# Patient Record
Sex: Female | Born: 1935 | Race: Black or African American | Hispanic: No
Health system: Southern US, Community
[De-identification: ages and names within clinical notes are randomized; demographics above are authoritative.]

---

## 1999-08-24 ENCOUNTER — Encounter: Admission: RE | Admit: 1999-08-24 | Discharge: 1999-08-24 | Payer: Self-pay | Admitting: Internal Medicine

## 1999-08-24 ENCOUNTER — Encounter: Payer: Self-pay | Admitting: Internal Medicine

## 2000-08-24 ENCOUNTER — Encounter: Admission: RE | Admit: 2000-08-24 | Discharge: 2000-08-24 | Payer: Self-pay | Admitting: Internal Medicine

## 2000-08-24 ENCOUNTER — Encounter: Payer: Self-pay | Admitting: Internal Medicine

## 2001-02-27 ENCOUNTER — Encounter (INDEPENDENT_AMBULATORY_CARE_PROVIDER_SITE_OTHER): Payer: Self-pay | Admitting: Specialist

## 2001-02-27 ENCOUNTER — Ambulatory Visit (HOSPITAL_BASED_OUTPATIENT_CLINIC_OR_DEPARTMENT_OTHER): Admission: RE | Admit: 2001-02-27 | Discharge: 2001-02-27 | Payer: Self-pay | Admitting: General Surgery

## 2001-06-28 ENCOUNTER — Encounter: Payer: Self-pay | Admitting: Gastroenterology

## 2001-08-25 ENCOUNTER — Encounter: Payer: Self-pay | Admitting: Internal Medicine

## 2001-08-25 ENCOUNTER — Encounter: Admission: RE | Admit: 2001-08-25 | Discharge: 2001-08-25 | Payer: Self-pay | Admitting: Internal Medicine

## 2001-10-24 ENCOUNTER — Encounter: Payer: Self-pay | Admitting: Internal Medicine

## 2001-10-24 ENCOUNTER — Encounter: Admission: RE | Admit: 2001-10-24 | Discharge: 2001-10-24 | Payer: Self-pay | Admitting: Internal Medicine

## 2002-02-12 ENCOUNTER — Ambulatory Visit (HOSPITAL_COMMUNITY): Admission: RE | Admit: 2002-02-12 | Discharge: 2002-02-12 | Payer: Self-pay | Admitting: Infectious Diseases

## 2002-02-12 ENCOUNTER — Encounter: Payer: Self-pay | Admitting: Infectious Diseases

## 2002-09-11 ENCOUNTER — Encounter: Payer: Self-pay | Admitting: Internal Medicine

## 2002-09-11 ENCOUNTER — Encounter: Admission: RE | Admit: 2002-09-11 | Discharge: 2002-09-11 | Payer: Self-pay | Admitting: Internal Medicine

## 2003-05-08 ENCOUNTER — Encounter: Admission: RE | Admit: 2003-05-08 | Discharge: 2003-05-08 | Payer: Self-pay | Admitting: Internal Medicine

## 2004-07-13 ENCOUNTER — Encounter: Admission: RE | Admit: 2004-07-13 | Discharge: 2004-07-13 | Payer: Self-pay | Admitting: Internal Medicine

## 2005-08-04 ENCOUNTER — Encounter: Admission: RE | Admit: 2005-08-04 | Discharge: 2005-08-04 | Payer: Self-pay | Admitting: Neurology

## 2006-08-16 ENCOUNTER — Encounter: Admission: RE | Admit: 2006-08-16 | Discharge: 2006-08-16 | Payer: Self-pay | Admitting: Internal Medicine

## 2006-11-28 ENCOUNTER — Ambulatory Visit: Payer: Self-pay | Admitting: Gastroenterology

## 2007-01-10 ENCOUNTER — Encounter: Payer: Self-pay | Admitting: Gastroenterology

## 2007-01-10 ENCOUNTER — Ambulatory Visit: Payer: Self-pay | Admitting: Gastroenterology

## 2007-01-10 DIAGNOSIS — K210 Gastro-esophageal reflux disease with esophagitis: Secondary | ICD-10-CM

## 2007-01-10 DIAGNOSIS — K449 Diaphragmatic hernia without obstruction or gangrene: Secondary | ICD-10-CM | POA: Insufficient documentation

## 2007-07-17 ENCOUNTER — Ambulatory Visit: Payer: Self-pay | Admitting: Surgery

## 2007-08-29 ENCOUNTER — Encounter: Admission: RE | Admit: 2007-08-29 | Discharge: 2007-08-29 | Payer: Self-pay | Admitting: Internal Medicine

## 2007-11-02 DIAGNOSIS — R1013 Epigastric pain: Secondary | ICD-10-CM | POA: Insufficient documentation

## 2007-11-02 DIAGNOSIS — K2270 Barrett's esophagus without dysplasia: Secondary | ICD-10-CM

## 2007-11-02 DIAGNOSIS — E039 Hypothyroidism, unspecified: Secondary | ICD-10-CM | POA: Insufficient documentation

## 2007-11-02 DIAGNOSIS — E785 Hyperlipidemia, unspecified: Secondary | ICD-10-CM | POA: Insufficient documentation

## 2007-11-02 DIAGNOSIS — K219 Gastro-esophageal reflux disease without esophagitis: Secondary | ICD-10-CM

## 2008-08-30 ENCOUNTER — Encounter: Admission: RE | Admit: 2008-08-30 | Discharge: 2008-08-30 | Payer: Self-pay | Admitting: Internal Medicine

## 2009-09-05 ENCOUNTER — Encounter: Admission: RE | Admit: 2009-09-05 | Discharge: 2009-09-05 | Payer: Self-pay | Admitting: Internal Medicine

## 2010-08-07 ENCOUNTER — Other Ambulatory Visit: Payer: Self-pay | Admitting: Internal Medicine

## 2010-08-07 DIAGNOSIS — Z1231 Encounter for screening mammogram for malignant neoplasm of breast: Secondary | ICD-10-CM

## 2010-09-08 ENCOUNTER — Ambulatory Visit: Payer: Self-pay

## 2010-09-24 ENCOUNTER — Ambulatory Visit
Admission: RE | Admit: 2010-09-24 | Discharge: 2010-09-24 | Disposition: A | Discharge status: Home / Self Care | Payer: Self-pay | Source: Ambulatory Visit | Attending: Internal Medicine | Admitting: Internal Medicine

## 2010-09-24 DIAGNOSIS — Z1231 Encounter for screening mammogram for malignant neoplasm of breast: Secondary | ICD-10-CM

## 2010-11-03 NOTE — Assessment & Plan Note (Signed)
[facility]                         GASTROENTEROLOGY OFFICE NOTE   MEI, SUITS                       MRN:          [id number]  DATE:11/28/2006                            DOB:          29-Oct-1929    REFERRING PHYSICIAN:  Larina Earthly, M.D.   REASON FOR REFERRAL:  Gastroesophageal reflux disease and epigastric  pain.   HISTORY OF PRESENT ILLNESS:  Ms. Wieczorek is a very nice, 77 year old  female that I have previously evaluated. She underwent a screening  colonoscopy in January 2003 that was normal. She has a history of GERD  and related reflux symptoms when I evaluated her in 2002. She was  initially treated with Pepcid AC and then she was changed to Protonix  which she felt worked quite well for her. She was changed to Prilosec  OTC but feels her symptoms are not adequately controlled on Prilosec  OTC. She has intermittent burning epigastric pain and burning substernal  pain that is typically brought on by meals. She occasionally has some  gas and bloating. She notes no dysphagia, odynophagia, weight loss,  change in bowel habits, melena or hematochezia. She states she had an  upper GI series performed about 3 years ago that showed a sliding hiatal  hernia and no other abnormalities.   PAST MEDICAL HISTORY:  Hyperlipidemia, hypothyroidism, arthritis, status  post appendectomy, status post C sections x3, status post total  abdominal hysterectomy, status post lipoma removed from her back, status  post right hand surgery, osteoporosis, status post bunionectomy.   MEDICATIONS:  Listed on the chart updated and reviewed.   MEDICATION ALLERGIES:  DEMEROL, DARVOCET, CODEINE.   SOCIAL HISTORY:  Per the handwritten form.   REVIEW OF SYSTEMS:  Per the handwritten form.   PHYSICAL EXAMINATION:  GENERAL:  Well-developed, well-nourished, no  acute distress.  VITAL SIGNS:  Height 5 feet 2 inches, weight 141 pounds. Blood pressure  is 118/66, pulse 76  and regular.  HEENT:  Anicteric sclera. Oropharynx clear.  CHEST:  Clear to auscultation bilaterally.  CARDIAC:  Regular rate and rhythm without murmurs appreciated.  ABDOMEN:  Soft, mild epigastric tenderness to deep palpation. No rebound  or guarding. No palpable organomegaly, masses or hernia. Normal active  bowel sounds.  EXTREMITIES:  Without clubbing, cyanosis or edema.  NEUROLOGIC:  Alert and oriented x3. Grossly nonfocal.   ASSESSMENT/PLAN:  Chronic gastroesophageal reflux disease with fair  symptom control. Rule out erosive esophagitis and Barrett's esophagus.  Change to pantoprazole 40 mg p.o. q.a.m. and discontinue Prilosec. She  was given written instructions on all standard antireflux measures. The  risks, benefits, and alternatives to upper endoscopy with possible  biopsy discussed with the patient and she consents to proceed. This will  be scheduled electively.     Venita Lick. Russella Dar, MD, [facility]  Electronically Signed    MTS/MedQ  DD: 11/28/2006  DT: 11/28/2006  Job #: [id number]   cc:   Larina Earthly, M.D.

## 2010-11-03 NOTE — Procedures (Signed)
DUPLEX DEEP VENOUS EXAM - LOWER EXTREMITY   INDICATION:  Rule out DVT, left leg.   HISTORY:  Edema:  Left calf for one week.  Trauma/Surgery:  No.  Pain:  Left leg discomfort.  PE:  No.  Previous DVT:  No.  Anticoagulants:  No.  Other:   DUPLEX EXAM:                CFV   SFV   PopV  PTV    GSV                R  L  R  L  R  L  R   L  R  L  Thrombosis    o  o     o     o      o     o  Spontaneous   +  +     +     +      +     +  Phasic        +  +     +     +      +     +  Augmentation  +  +     +     +      +     +  Compressible  +  +     +     +      +     +  Competent     +  +     +     +      +     0   Legend:  + - yes  o - no  p - partial  D - decreased   IMPRESSION:  1. No evidence of left leg deep venous thrombosis.  2. Left greater saphenous vein was noted to be incompetent.   A preliminary copy was faxed, and a report was called to Dr. Vicente Males  office.         _____________________________  Janetta Hora. Darrick Penna, MD   DP/MEDQ  D:  07/17/2007  T:  07/18/2007  Job:  [id number]

## 2010-11-06 NOTE — Op Note (Signed)
[facility]  Patient:    Gabriela Garcia, Gabriela Garcia Visit Number: [id number] MRN: [id number]          Service Type: DSU Location: [facility] Attending Physician:  Tempie Donning Dictated by:   Gita Kudo, M.D. Proc. Date: 02/27/01 Admit Date:  02/27/2001   CC:         Ravi R. Felipa Eth, M.D.   Operative Report  PREOPERATIVE DIAGNOSIS:  Mass right back near scapula.  POSTOPERATIVE DIAGNOSIS:  Mass right back near scapula.  OPERATION PERFORMED:  Excision of mass right posterior back, near scapula.  SURGEON:  Gita Kudo, M.D.  ANESTHESIA:  MAC--IV sedation, local 1% Xylocaine.  INDICATIONS FOR PROCEDURE:  The patient is a 77 year old female with an enlarging mass of her right back over several years.  It is uncomfortable when she presses against it.  It has enlarged by documentation of the patient and her family.  OPERATIVE FINDINGS:  There was a multilobulated benign-appearing lipoma that measured approximately 7 x 7 cm.  DESCRIPTION OF PROCEDURE:  Under satisfactory intravenous sedation, the patient was placed in the prone position and prepped and draped in a standard fashion.  A total of 30 cc of 1% Xylocaine with epinephrine was infiltrated throughout the entire procedure giving good analgesia.  A transverse incision was made centered over the mass and carried down into the subcutaneous with cautery.  The mass was then grasped and because of multiple lobulations and pockets, was carefully dissected using scalpel as well as cautery.  When completed and the mass was removed, it was noted that it actually had perforated through the fascia on the few areas.  The fascia over the trapezius was approximated with 2-0 Vicryl.  Then the wound was approximated with interrupted and running 4-0 and 3-0 nylon.  A sterile absorbent compressive dressing was applied and the patient went to the recovery room from the operating room in good condition without  complication. Dictated by:   Gita Kudo, M.D. Attending Physician:  Tempie Donning DD:  02/27/01 TD:  02/27/01 Job: [id number]

## 2011-07-26 ENCOUNTER — Encounter: Payer: Self-pay | Admitting: Gastroenterology

## 2011-11-09 ENCOUNTER — Other Ambulatory Visit: Payer: Self-pay | Admitting: Internal Medicine

## 2011-11-09 DIAGNOSIS — Z1231 Encounter for screening mammogram for malignant neoplasm of breast: Secondary | ICD-10-CM

## 2011-12-06 ENCOUNTER — Ambulatory Visit: Payer: Medicare Other

## 2011-12-09 ENCOUNTER — Ambulatory Visit
Admission: RE | Admit: 2011-12-09 | Discharge: 2011-12-09 | Disposition: A | Discharge status: Home / Self Care | Payer: Medicare Other | Source: Ambulatory Visit | Attending: Internal Medicine | Admitting: Internal Medicine

## 2011-12-09 DIAGNOSIS — Z1231 Encounter for screening mammogram for malignant neoplasm of breast: Secondary | ICD-10-CM

## 2012-03-24 ENCOUNTER — Encounter: Payer: Self-pay | Admitting: Gastroenterology

## 2012-12-13 ENCOUNTER — Other Ambulatory Visit: Payer: Self-pay

## 2012-12-13 DIAGNOSIS — Z1231 Encounter for screening mammogram for malignant neoplasm of breast: Secondary | ICD-10-CM

## 2013-01-04 ENCOUNTER — Ambulatory Visit: Payer: Medicare Other

## 2013-01-31 ENCOUNTER — Ambulatory Visit
Admission: RE | Admit: 2013-01-31 | Discharge: 2013-01-31 | Disposition: A | Discharge status: Home / Self Care | Payer: Medicare Other | Source: Ambulatory Visit

## 2013-01-31 DIAGNOSIS — Z1231 Encounter for screening mammogram for malignant neoplasm of breast: Secondary | ICD-10-CM

## 2014-03-15 ENCOUNTER — Other Ambulatory Visit: Payer: Self-pay

## 2014-03-15 DIAGNOSIS — Z1231 Encounter for screening mammogram for malignant neoplasm of breast: Secondary | ICD-10-CM

## 2014-04-02 ENCOUNTER — Ambulatory Visit
Admission: RE | Admit: 2014-04-02 | Discharge: 2014-04-02 | Disposition: A | Discharge status: Home / Self Care | Payer: Medicare Other | Source: Ambulatory Visit

## 2014-04-02 ENCOUNTER — Encounter (INDEPENDENT_AMBULATORY_CARE_PROVIDER_SITE_OTHER): Payer: Self-pay

## 2014-04-02 DIAGNOSIS — Z1231 Encounter for screening mammogram for malignant neoplasm of breast: Secondary | ICD-10-CM

## 2015-04-03 ENCOUNTER — Other Ambulatory Visit: Payer: Self-pay

## 2015-04-03 DIAGNOSIS — Z1231 Encounter for screening mammogram for malignant neoplasm of breast: Secondary | ICD-10-CM

## 2015-04-23 ENCOUNTER — Ambulatory Visit
Admission: RE | Admit: 2015-04-23 | Discharge: 2015-04-23 | Disposition: A | Discharge status: Home / Self Care | Payer: Medicare Other | Source: Ambulatory Visit

## 2015-04-23 DIAGNOSIS — Z1231 Encounter for screening mammogram for malignant neoplasm of breast: Secondary | ICD-10-CM

## 2016-04-13 ENCOUNTER — Other Ambulatory Visit: Payer: Self-pay | Admitting: Internal Medicine

## 2016-04-13 DIAGNOSIS — Z1231 Encounter for screening mammogram for malignant neoplasm of breast: Secondary | ICD-10-CM

## 2016-04-30 ENCOUNTER — Ambulatory Visit: Payer: Medicare Other

## 2016-05-11 ENCOUNTER — Ambulatory Visit
Admission: RE | Admit: 2016-05-11 | Discharge: 2016-05-11 | Disposition: A | Discharge status: Home / Self Care | Payer: Medicare Other | Source: Ambulatory Visit | Attending: Internal Medicine | Admitting: Internal Medicine

## 2016-05-11 DIAGNOSIS — Z1231 Encounter for screening mammogram for malignant neoplasm of breast: Secondary | ICD-10-CM

## 2017-04-05 ENCOUNTER — Other Ambulatory Visit: Payer: Self-pay | Admitting: Internal Medicine

## 2017-04-05 DIAGNOSIS — Z1231 Encounter for screening mammogram for malignant neoplasm of breast: Secondary | ICD-10-CM

## 2017-05-16 ENCOUNTER — Ambulatory Visit
Admission: RE | Admit: 2017-05-16 | Discharge: 2017-05-16 | Disposition: A | Discharge status: Home / Self Care | Payer: Medicare Other | Source: Ambulatory Visit | Attending: Internal Medicine | Admitting: Internal Medicine

## 2017-05-16 DIAGNOSIS — Z1231 Encounter for screening mammogram for malignant neoplasm of breast: Secondary | ICD-10-CM

## 2018-04-11 ENCOUNTER — Other Ambulatory Visit: Payer: Self-pay | Admitting: Internal Medicine

## 2018-04-11 DIAGNOSIS — Z1231 Encounter for screening mammogram for malignant neoplasm of breast: Secondary | ICD-10-CM

## 2018-05-23 ENCOUNTER — Ambulatory Visit
Admission: RE | Admit: 2018-05-23 | Discharge: 2018-05-23 | Disposition: A | Discharge status: Home / Self Care | Payer: Medicare Other | Source: Ambulatory Visit | Attending: Internal Medicine | Admitting: Internal Medicine

## 2018-05-23 DIAGNOSIS — Z1231 Encounter for screening mammogram for malignant neoplasm of breast: Secondary | ICD-10-CM

## 2019-07-03 ENCOUNTER — Other Ambulatory Visit: Payer: Self-pay | Admitting: Internal Medicine

## 2019-07-03 DIAGNOSIS — Z1231 Encounter for screening mammogram for malignant neoplasm of breast: Secondary | ICD-10-CM

## 2019-08-10 ENCOUNTER — Ambulatory Visit
Admission: RE | Admit: 2019-08-10 | Discharge: 2019-08-10 | Disposition: A | Discharge status: Home / Self Care | Payer: Medicare Other | Source: Ambulatory Visit | Attending: Internal Medicine | Admitting: Internal Medicine

## 2019-08-10 ENCOUNTER — Other Ambulatory Visit: Payer: Self-pay

## 2019-08-10 DIAGNOSIS — Z1231 Encounter for screening mammogram for malignant neoplasm of breast: Secondary | ICD-10-CM

## 2019-08-13 ENCOUNTER — Ambulatory Visit: Payer: Medicare Other | Attending: Internal Medicine

## 2019-08-13 DIAGNOSIS — Z23 Encounter for immunization: Secondary | ICD-10-CM | POA: Insufficient documentation

## 2019-08-13 NOTE — Progress Notes (Signed)
   Covid-19 Vaccination Clinic  Name:  Gabriela Garcia    MRN: [id number] DOB: [birth year]  08/13/2019  [name] was observed post Covid-19 immunization for 15 minutes without incidence. She was provided with Vaccine Information Sheet and instruction to access the V-Safe system.   [name] was instructed to call 911 with any severe reactions post vaccine: Marland Kitchen Difficulty breathing  . Swelling of your face and throat  . A fast heartbeat  . A bad rash all over your body  . Dizziness and weakness    Immunizations Administered    Name Date Dose VIS Date Route   Moderna COVID-19 Vaccine 08/13/2019 10:25 AM 0.5 mL 05/22/2019 Intramuscular   Manufacturer: Moderna   Lot: [id number]   NDC: 81661-969-40

## 2019-09-11 ENCOUNTER — Ambulatory Visit: Payer: Medicare Other | Attending: Internal Medicine

## 2019-09-11 DIAGNOSIS — Z23 Encounter for immunization: Secondary | ICD-10-CM

## 2019-09-11 NOTE — Progress Notes (Signed)
   Covid-19 Vaccination Clinic  Name:  Gabriela Garcia    MRN: [id number] DOB: 04/22/1930  09/11/2019  [name] was observed post Covid-19 immunization for 15 minutes without incident. She was provided with Vaccine Information Sheet and instruction to access the V-Safe system.   [name] was instructed to call 911 with any severe reactions post vaccine: Marland Kitchen Difficulty breathing  . Swelling of face and throat  . A fast heartbeat  . A bad rash all over body  . Dizziness and weakness   Immunizations Administered    Name Date Dose VIS Date Route   Moderna COVID-19 Vaccine 09/11/2019 10:35 AM 0.5 mL 05/22/2019 Intramuscular   Manufacturer: Gala Murdoch   Lot: [id number]   NDC: 59563-875-64

## 2020-07-22 ENCOUNTER — Other Ambulatory Visit: Payer: Self-pay | Admitting: Internal Medicine

## 2020-07-22 DIAGNOSIS — Z1231 Encounter for screening mammogram for malignant neoplasm of breast: Secondary | ICD-10-CM

## 2020-09-04 ENCOUNTER — Ambulatory Visit: Payer: Medicare Other

## 2020-10-10 ENCOUNTER — Inpatient Hospital Stay: Admission: RE | Admit: 2020-10-10 | Payer: Medicare Other | Source: Ambulatory Visit

## 2020-10-13 IMAGING — MG DIGITAL SCREENING BILAT W/ TOMO W/ CAD
8 series · 9 of 24 positions shown · non-contrast
Comparison: Previous exam(s).

CLINICAL DATA: Screening.

EXAM:
DIGITAL SCREENING BILATERAL MAMMOGRAM WITH TOMO AND CAD

[R CC synth-2D]
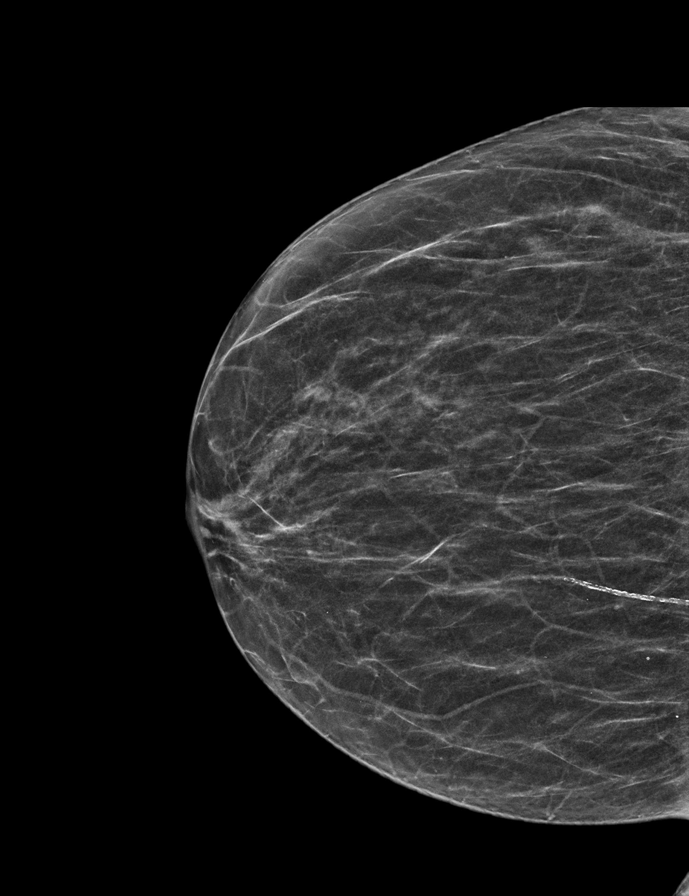

[L CC synth-2D]
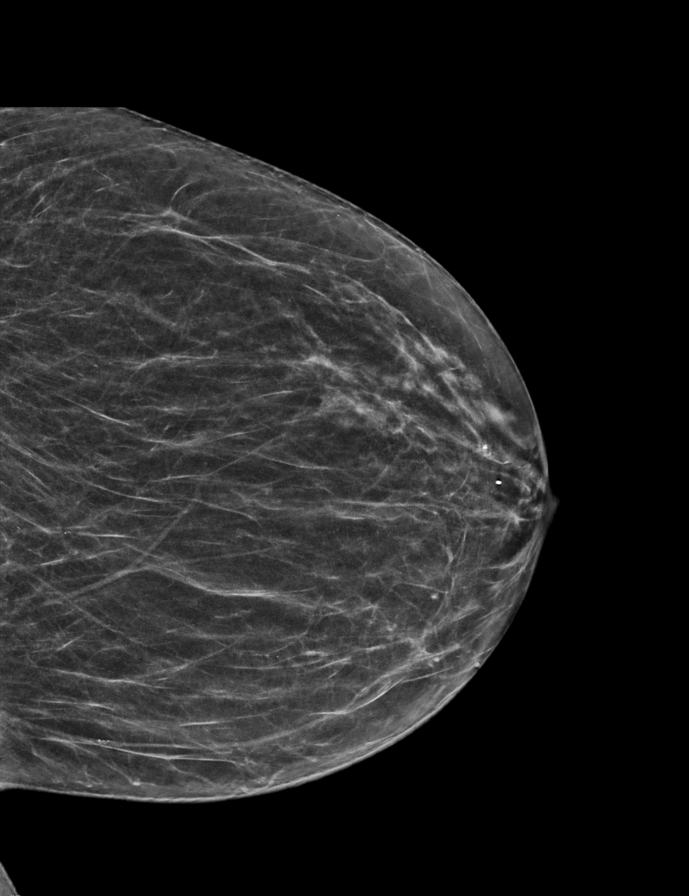

[L MLO synth-2D]
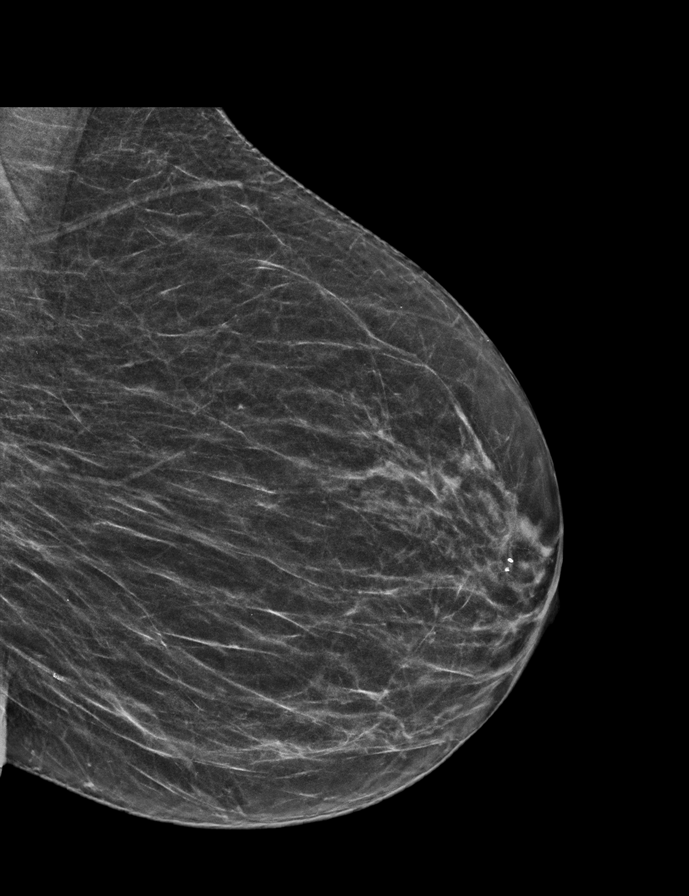

[R MLO synth-2D]
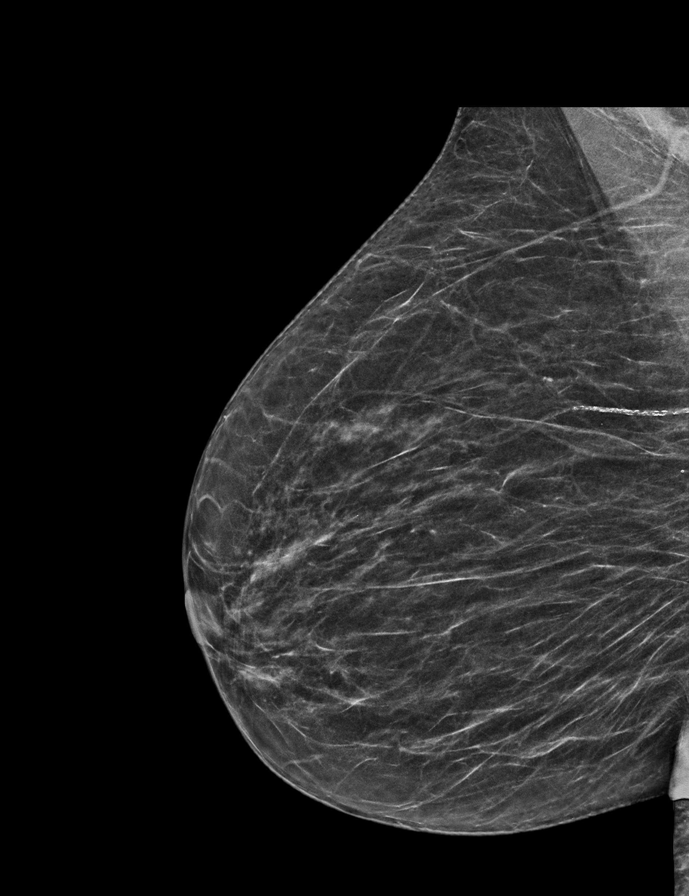

[R CC tomo · 2 of 46 frames shown]
[frame 15/46]
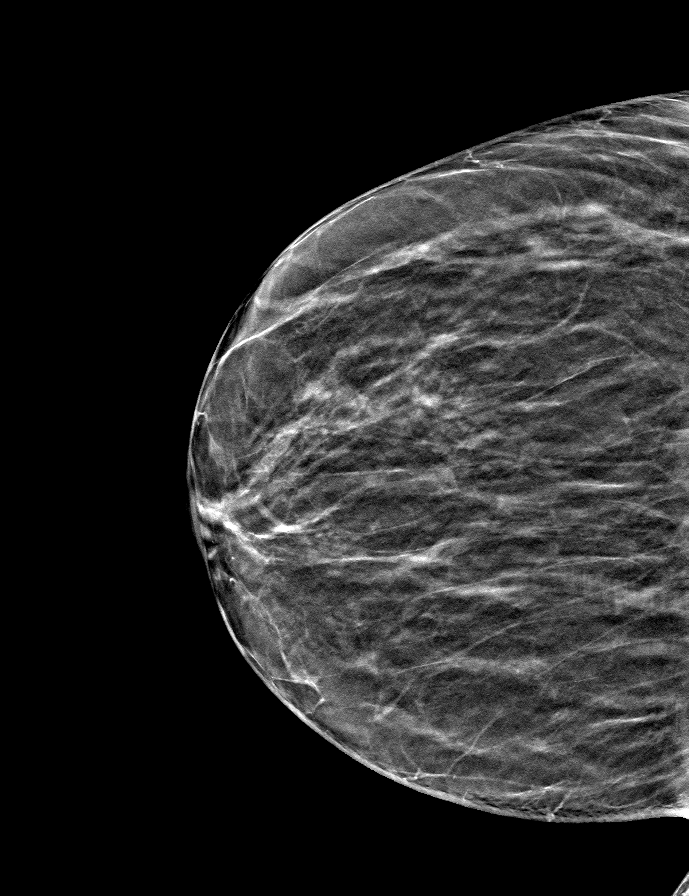
[frame 23/46]
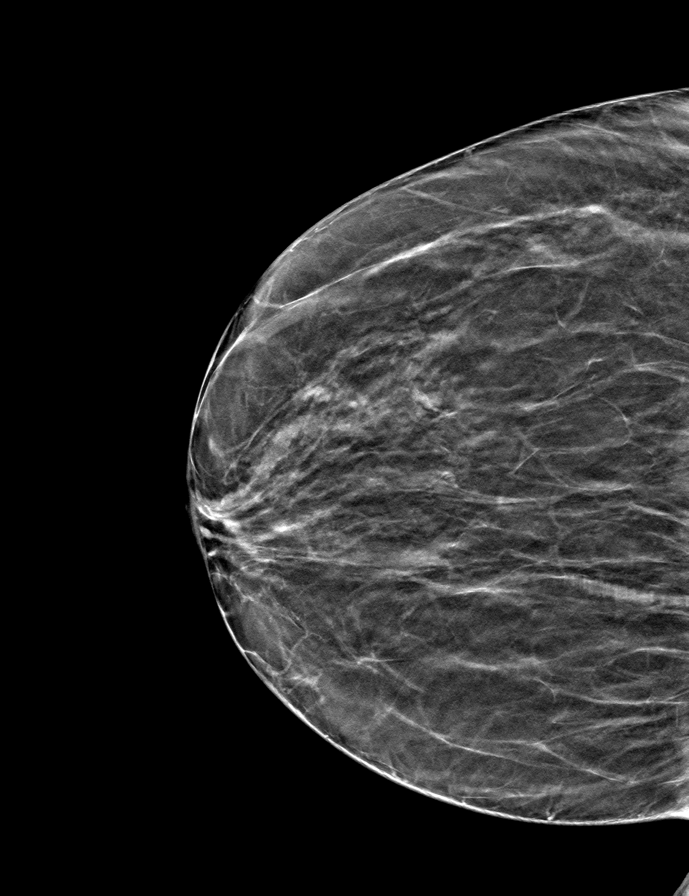

[R MLO tomo · tomo slice 25/50.0]
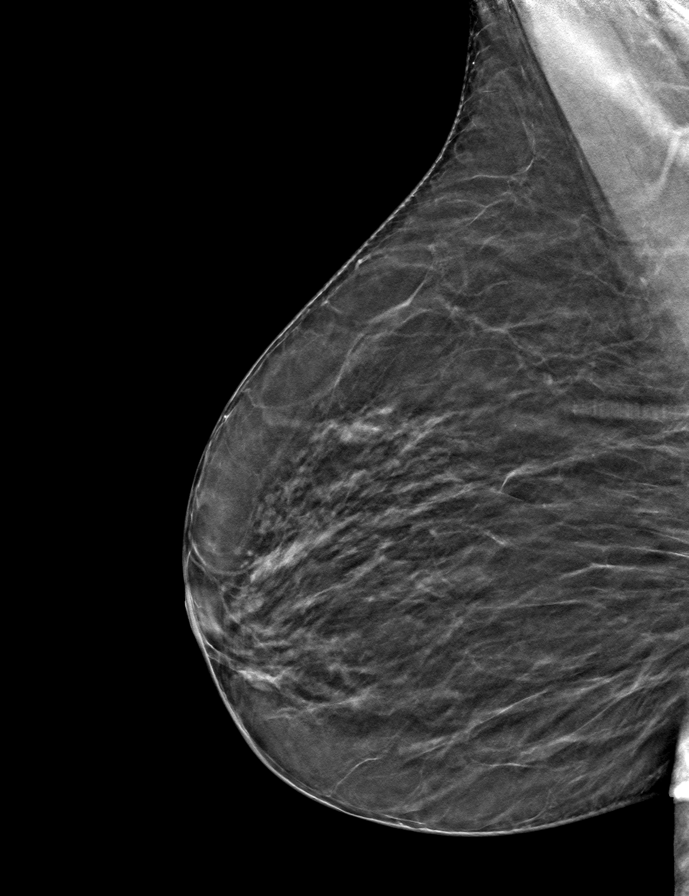

[L CC tomo · tomo slice 25/49.0]
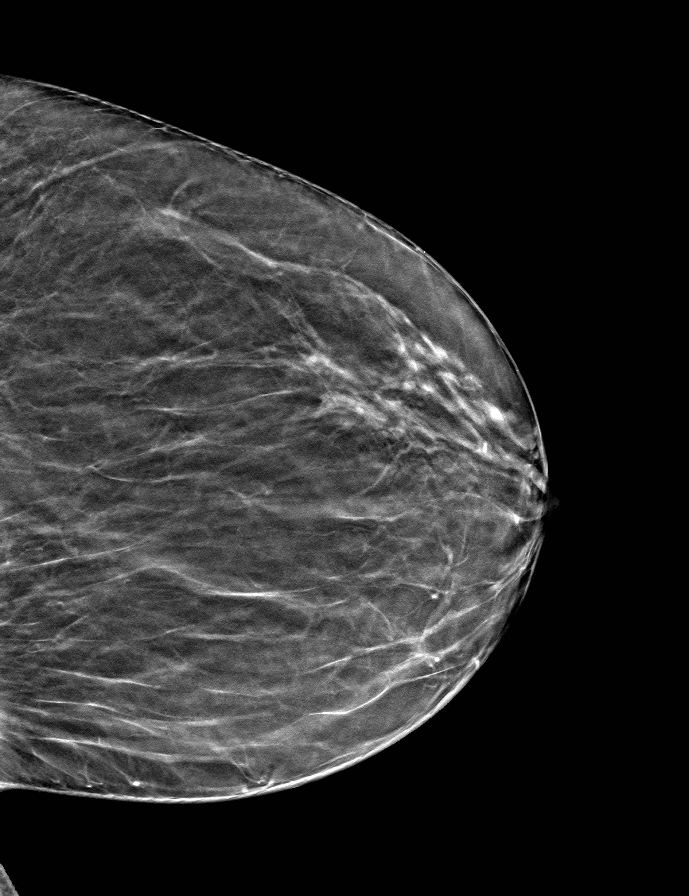

[L MLO tomo · tomo slice 25/49.0]
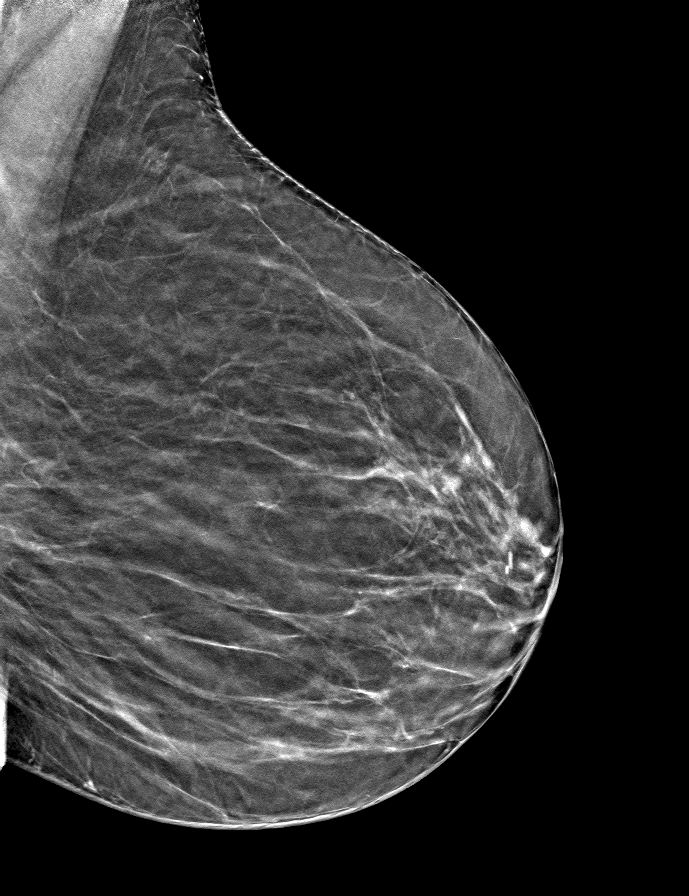

[9 of 24 positions shown; findings below may reference images not displayed]

ACR Breast Density Category b: There are scattered areas of
fibroglandular density.
FINDINGS: There are no findings suspicious for malignancy. Images were
processed with CAD.
IMPRESSION: No mammographic evidence of malignancy. A result letter of this
screening mammogram will be mailed directly to the patient.

RECOMMENDATION:
Screening mammogram in one year. (Code:[id number])

BI-RADS CATEGORY  1: Negative.

## 2020-11-28 ENCOUNTER — Ambulatory Visit: Payer: Medicare Other

## 2021-01-22 ENCOUNTER — Inpatient Hospital Stay: Admission: RE | Admit: 2021-01-22 | Payer: Medicare Other | Source: Ambulatory Visit

## 2021-04-01 ENCOUNTER — Ambulatory Visit: Payer: Medicare Other

## 2021-11-26 ENCOUNTER — Other Ambulatory Visit: Payer: Self-pay | Admitting: Internal Medicine

## 2021-11-26 DIAGNOSIS — Z1231 Encounter for screening mammogram for malignant neoplasm of breast: Secondary | ICD-10-CM

## 2021-12-23 ENCOUNTER — Ambulatory Visit
Admission: RE | Admit: 2021-12-23 | Discharge: 2021-12-23 | Disposition: A | Discharge status: Home / Self Care | Payer: Medicare Other | Source: Ambulatory Visit | Attending: Internal Medicine | Admitting: Internal Medicine

## 2021-12-23 DIAGNOSIS — Z1231 Encounter for screening mammogram for malignant neoplasm of breast: Secondary | ICD-10-CM
